# Patient Record
Sex: Male | Born: 1960 | Race: Black or African American | Hispanic: No | Marital: Single | Smoking: Current every day smoker
Health system: Southern US, Community
[De-identification: ages and names within clinical notes are randomized; demographics above are authoritative.]

## PROBLEM LIST (undated history)

## (undated) DIAGNOSIS — C61 Malignant neoplasm of prostate: Secondary | ICD-10-CM

## (undated) HISTORY — DX: Malignant neoplasm of prostate: C61

---

## 2001-08-10 ENCOUNTER — Emergency Department (HOSPITAL_COMMUNITY): Admission: EM | Admit: 2001-08-10 | Discharge: 2001-08-10 | Payer: Self-pay

## 2004-11-26 ENCOUNTER — Inpatient Hospital Stay (HOSPITAL_COMMUNITY): Admission: AC | Admit: 2004-11-26 | Discharge: 2004-12-02 | Payer: Self-pay

## 2005-10-10 IMAGING — CT CT ORBIT/TEMPORAL/IAC W/O CM
3 of 7 series · 16 of 47 positions shown, 19 images · non-contrast
Comparison: none

CLINICAL DATA: Acute right frontal fracture.
CT ORBITS WITHOUT CONTRAST ? 11/27/04:
TECHNIQUE: 1.25 mm collimation was utilized to scan the facial bones.  The data set was reconstructed in the coronal and sagittal plane.

[Series 104: orbits/facial prone · axial · 0.33mm/px · z∈[+63,+149]mm · 10 of 173 slices shown, 13 images]
[im 15/173  brain]
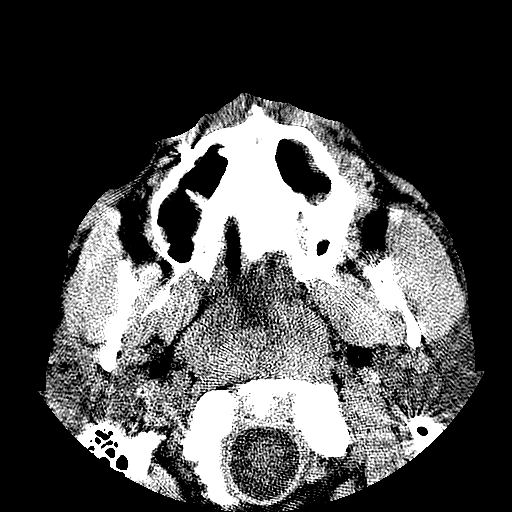
[im 15/173  bone]
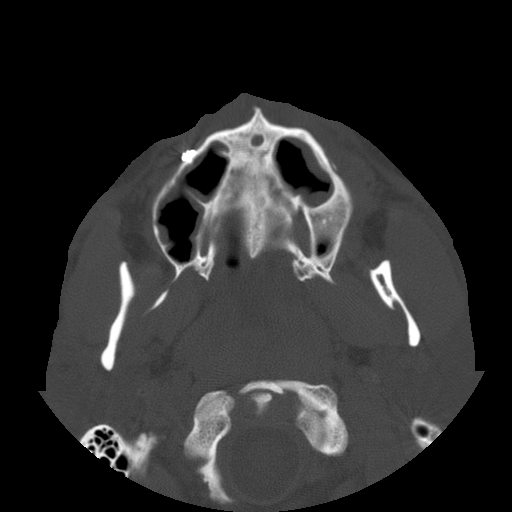
[im 29/173  bone]
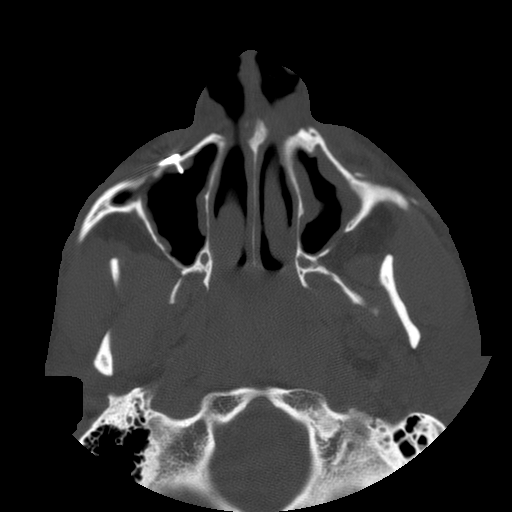
[im 44/173  bone]
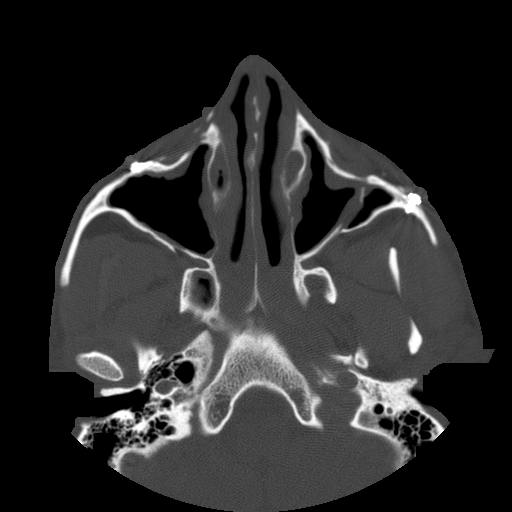
[im 58/173  bone]
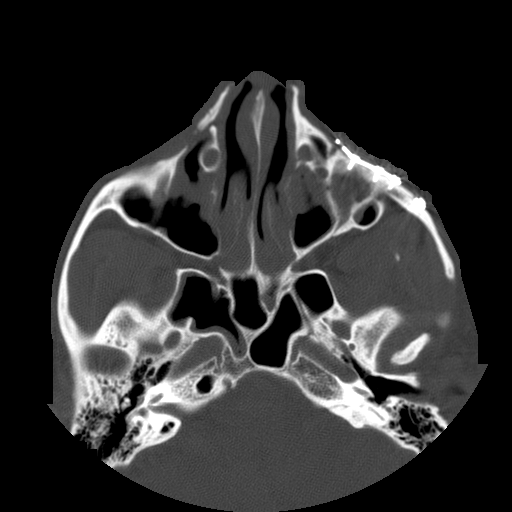
[im 72/173  brain]
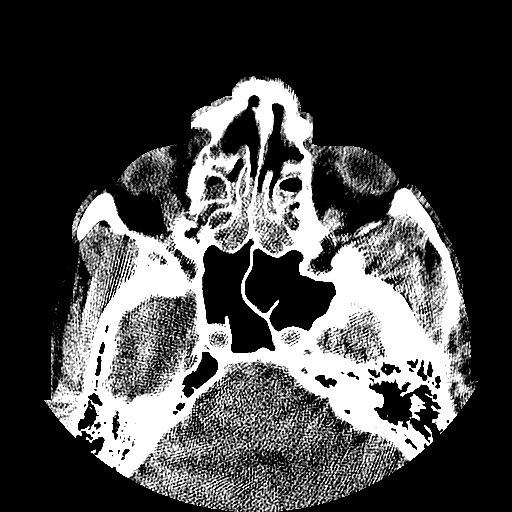
[im 72/173  bone]
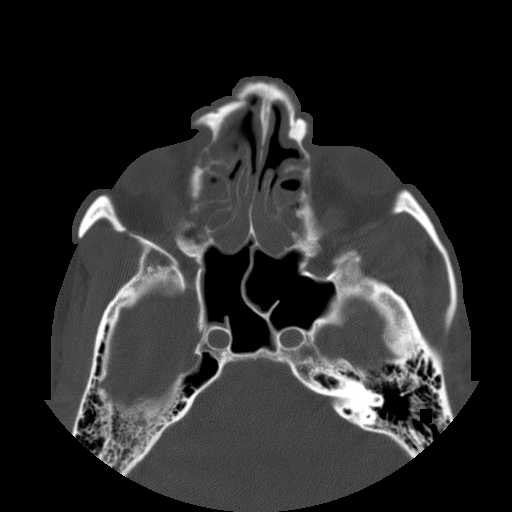
[im 101/173  bone]
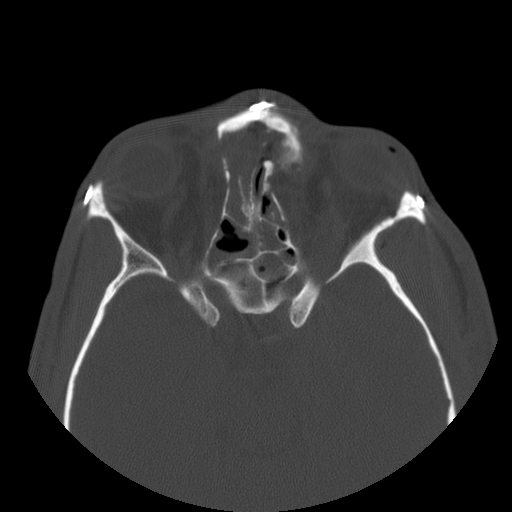
[im 115/173  bone]
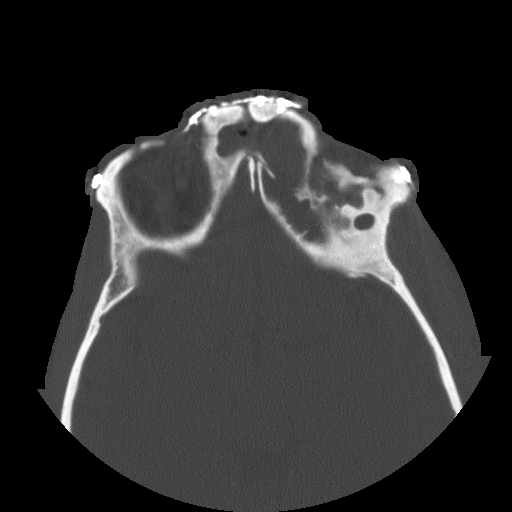
[im 130/173  bone]
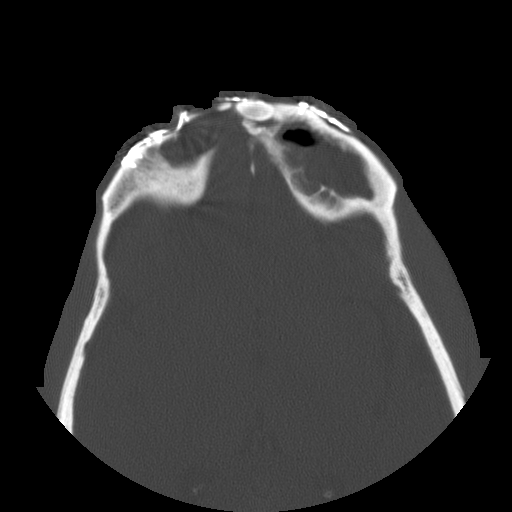
[im 144/173  brain]
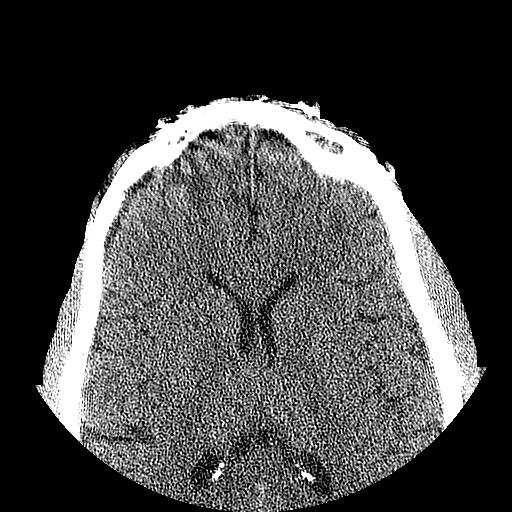
[im 144/173  bone]
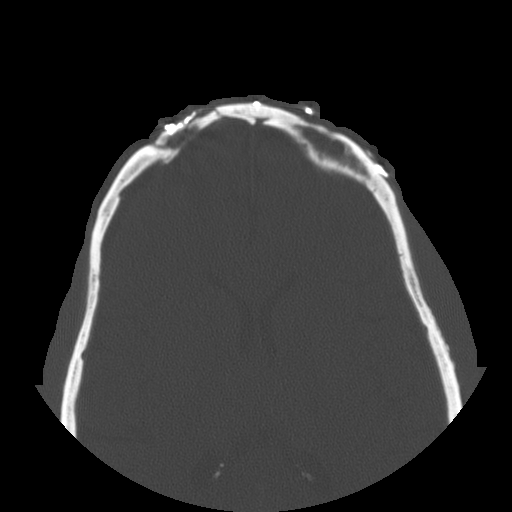
[im 158/173  bone]
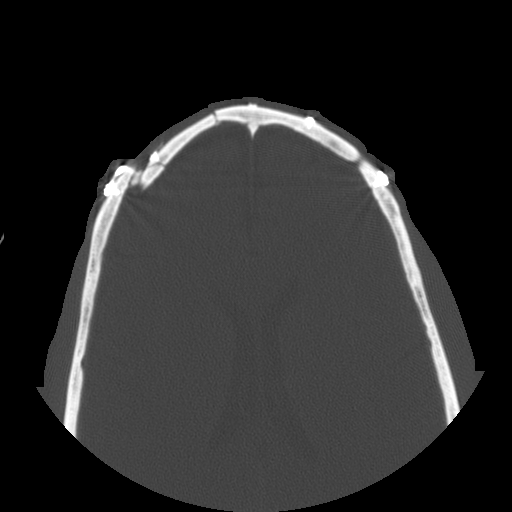

[Series 105: reformatted · coronal · 0.33mm/px · 3 of 54 slices shown (1 of 2)]
[im 18/54  bone]
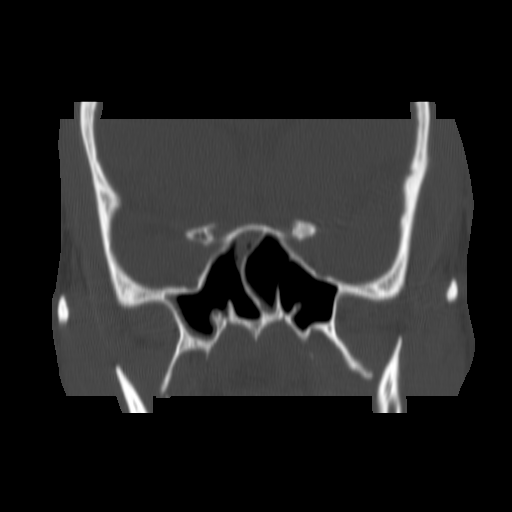
[im 24/54  bone]
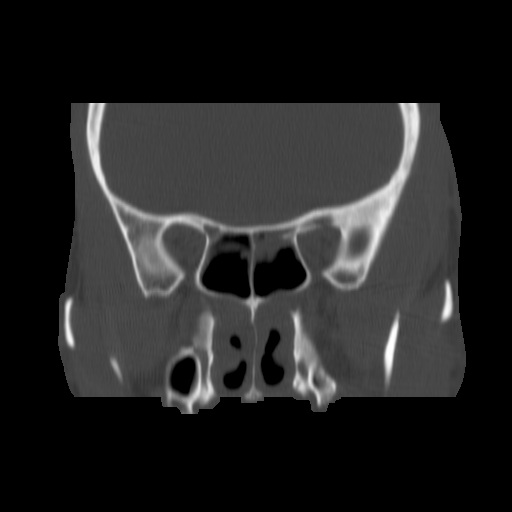
[im 30/54  bone]
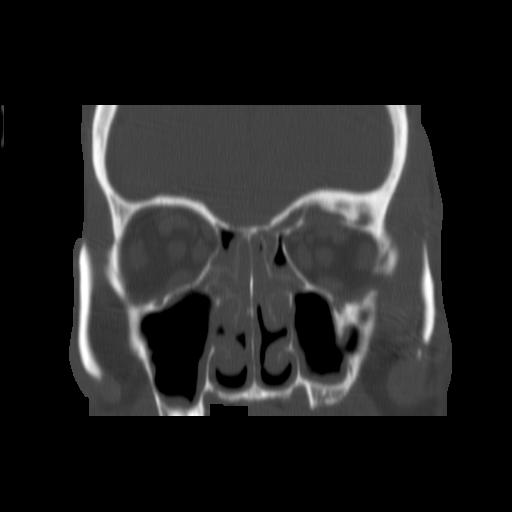

[Series 106: reformatted · sagittal · 0.33mm/px · 3 of 60 slices shown (2 of 2)]
[im 20/60  bone]
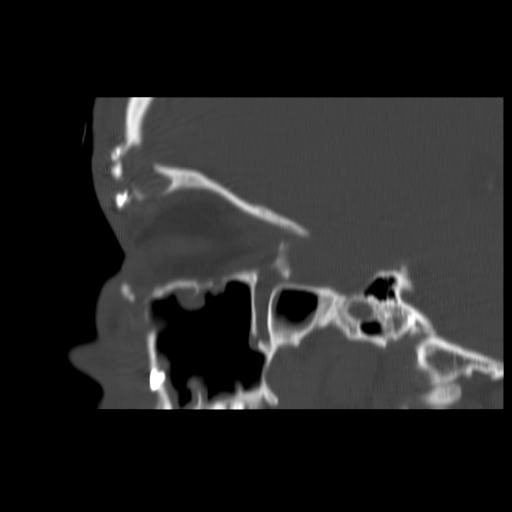
[im 30/60  bone]
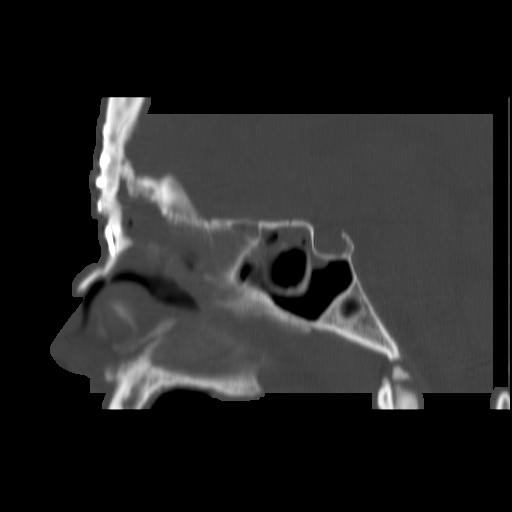
[im 40/60  bone]
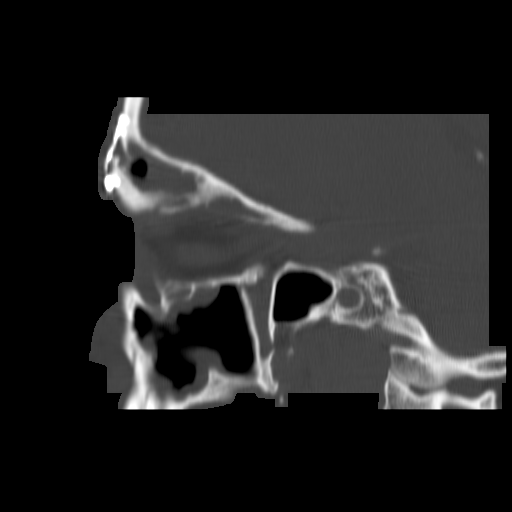

[16 of 47 positions shown; findings below may reference images not displayed]

FINDINGS: Extensive craniofacial reconstruction has been performed with internal plate and screw fixation of a previous left tripod fracture and a right maxillary fracture.  Bifrontal plate and screw fixation is evident from previous head trauma.  On the left side, there are remote medial and  inferior orbital fractures with bony deformity and depression.  No acute orbital fracture or extraocular muscle entrapment.  Extensive mucosal sinus disease is noted which appears chronic.  This involves the maxillary sinuses and ethmoid sinuses.  The frontal sinuses also have extensive mucosal thickening.  A small air-fluid level is evident in the left frontal sinus which could represent superimposed acute disease.  The sphenoid sinuses are less involved.  Previous displaced nasal bone fractures are evident.  
There is redemonstration of a displaced and slightly depressed right frontal skull fracture which appears to extend to outer table of the right frontal sinus.  The inner table of the right frontal sinus appears intact.  The zygomatic arches are intact.  The mandible is intact.  The condyles are located.
IMPRESSION: 1.  Extensive craniofacial previous trauma with reconstruction as described above.
2.  Acute right frontal displaced and slightly depressed skull fracture with no significant change.  The fracture does appear to extend at least into the outer table of the right frontal sinus.
3.  Extensive perisinus mucosal thickening which has a chronic appearance but superimposed acute left frontal sinusitis is not excluded where there is an air fluid level.

## 2005-10-12 IMAGING — CR DG CHEST 1V PORT
1 series · 1 of 1 positions shown · non-contrast
Comparison: 11/27/2004

CLINICAL DATA: Closed head injury, but later

PORTABLE CHEST - 1 VIEW:

[view not recorded]
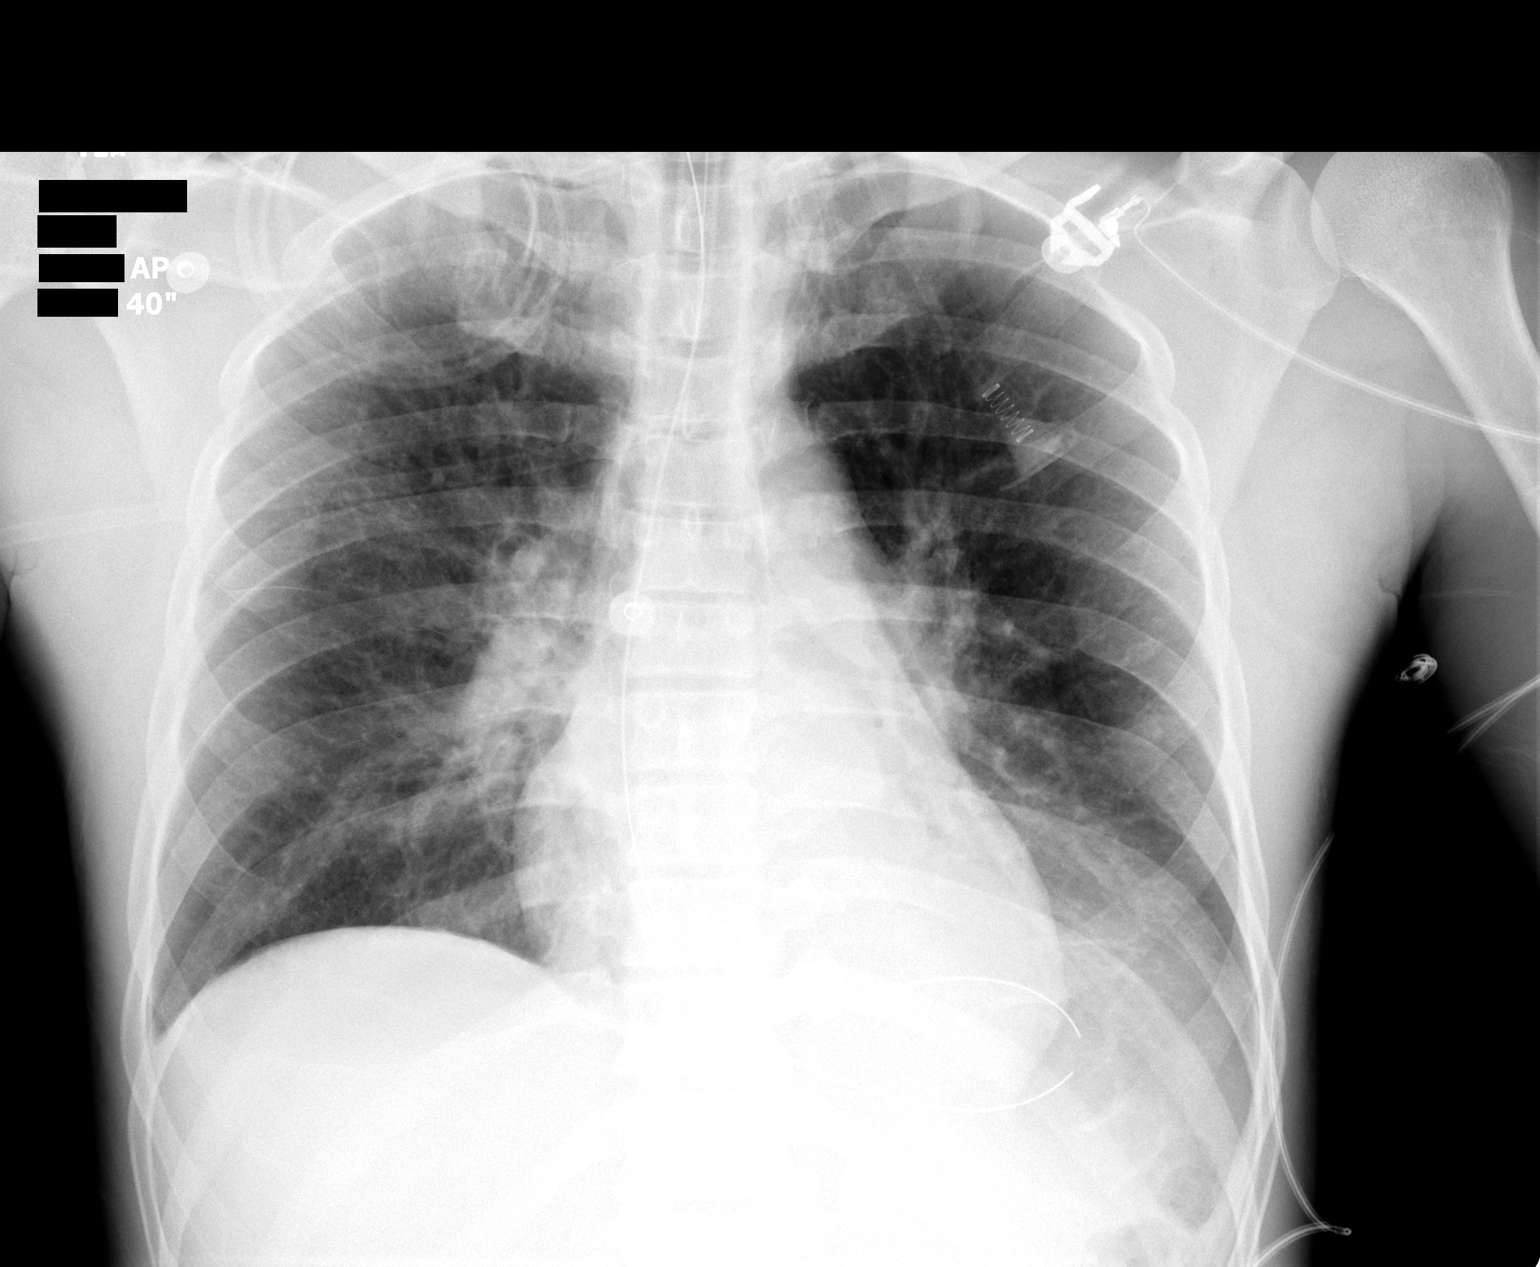

[1 of 1 positions shown; findings below may reference images not displayed]

FINDINGS: Endotracheal tube and NG tube are unchanged. Increasing left lower
lobe atelectasis. No significant opacity on the right.
IMPRESSION: Increasing left lower lobe atelectasis.

## 2018-06-13 ENCOUNTER — Other Ambulatory Visit: Payer: Self-pay | Admitting: Thoracic Surgery

## 2018-06-13 DIAGNOSIS — I10 Essential (primary) hypertension: Secondary | ICD-10-CM

## 2018-07-17 ENCOUNTER — Ambulatory Visit: Payer: Self-pay | Attending: Thoracic Surgery

## 2022-11-09 ENCOUNTER — Encounter (HOSPITAL_COMMUNITY): Payer: Self-pay | Admitting: Emergency Medicine

## 2022-11-09 ENCOUNTER — Other Ambulatory Visit: Payer: Self-pay

## 2022-11-09 ENCOUNTER — Emergency Department (HOSPITAL_COMMUNITY)
Admission: EM | Admit: 2022-11-09 | Discharge: 2022-11-09 | Discharge status: Against Medical Advice | Payer: Medicaid Other | Attending: Emergency Medicine | Admitting: Emergency Medicine

## 2022-11-09 DIAGNOSIS — M545 Low back pain, unspecified: Secondary | ICD-10-CM | POA: Insufficient documentation

## 2022-11-09 DIAGNOSIS — Z5321 Procedure and treatment not carried out due to patient leaving prior to being seen by health care provider: Secondary | ICD-10-CM | POA: Insufficient documentation

## 2022-11-09 LAB — URINALYSIS, ROUTINE W REFLEX MICROSCOPIC
Bilirubin Urine: NEGATIVE
Glucose, UA: NEGATIVE mg/dL
Hgb urine dipstick: NEGATIVE
Ketones, ur: NEGATIVE mg/dL
Leukocytes,Ua: NEGATIVE
Nitrite: NEGATIVE
Protein, ur: 30 mg/dL — AB
Specific Gravity, Urine: 1.024 (ref 1.005–1.030)
pH: 5 (ref 5.0–8.0)

## 2022-11-09 NOTE — ED Notes (Signed)
Called patient several times for vitals patient didn't answer

## 2022-11-09 NOTE — ED Provider Triage Note (Signed)
Emergency Medicine Provider Triage Evaluation Note  Cristian Lawrence , a 61 y.o. male  was evaluated in triage.  Pt complains of right lower back pain but then mentions that he has something in his penis. He denies any pain with urination or blood in his urine. He complains that he has a kidney stone and shows me how big it is with his fingers. He originally said his back pain hurt for a week, but then said it started yesterday. He reports that he does not want to talk with me because I am a girl.   Review of Systems  Positive:  Negative:   Physical Exam  BP (!) 156/84 (BP Location: Right Arm)   Pulse 79   Temp 98.3 F (36.8 C)   Resp 17   Ht 5' 9.5" (1.765 m)   Wt 106.6 kg   SpO2 97%   BMI 34.21 kg/m  Gen:   Awake, no distress   Resp:  Normal effort  MSK:   Moves extremities without difficulty  Other:  Ambulatory with ease. Denies a GU exam.   Medical Decision Making  Medically screening exam initiated at 10:10 AM.  Appropriate orders placed.  [name] was informed that the remainder of the evaluation will be completed by another provider, this initial triage assessment does not replace that evaluation, and the importance of remaining in the ED until their evaluation is complete.  Refusing to speak to this Probation officer as he wants to speak to a male provider.   From looking at his history. It appears he has a h/o prostatitis and psych.  Will order labs and plain film of pelvis for possible foreign body or stone in urethra.    Sherrell Puller, PA-C 11/09/22 [id number]

## 2022-11-09 NOTE — ED Triage Notes (Signed)
Pt. Stated, I have A kidney stone on my rt. Kidney. I can feel it. It started a week ago.
